# Patient Record
Sex: Male | Born: 1995 | Marital: Single | Smoking: Never smoker
Health system: Southern US, Community
[De-identification: ages and names within clinical notes are randomized; demographics above are authoritative.]

## PROBLEM LIST (undated history)

## (undated) DIAGNOSIS — K37 Unspecified appendicitis: Secondary | ICD-10-CM

## (undated) HISTORY — DX: Unspecified appendicitis: K37

---
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
## 2016-10-21 ENCOUNTER — Encounter: Payer: Self-pay | Admitting: Emergency Medicine

## 2016-10-21 ENCOUNTER — Emergency Department: Payer: Medicaid Other | Admitting: Registered Nurse

## 2016-10-21 ENCOUNTER — Encounter
Admission: EM | Disposition: A | Discharge status: Home / Self Care | Payer: Self-pay | Source: Home / Self Care | Attending: Emergency Medicine

## 2016-10-21 ENCOUNTER — Observation Stay
Admission: EM | Admit: 2016-10-21 | Discharge: 2016-10-22 | Disposition: A | Discharge status: Home / Self Care | Payer: Medicaid Other | Attending: Surgery | Admitting: Surgery

## 2016-10-21 ENCOUNTER — Emergency Department: Payer: Medicaid Other

## 2016-10-21 DIAGNOSIS — Z23 Encounter for immunization: Secondary | ICD-10-CM | POA: Diagnosis not present

## 2016-10-21 DIAGNOSIS — K353 Acute appendicitis with localized peritonitis: Principal | ICD-10-CM | POA: Insufficient documentation

## 2016-10-21 DIAGNOSIS — K37 Unspecified appendicitis: Secondary | ICD-10-CM

## 2016-10-21 DIAGNOSIS — K3580 Unspecified acute appendicitis: Secondary | ICD-10-CM | POA: Diagnosis present

## 2016-10-21 HISTORY — PX: LAPAROSCOPIC APPENDECTOMY: SHX408

## 2016-10-21 HISTORY — DX: Unspecified appendicitis: K37

## 2016-10-21 LAB — URINALYSIS COMPLETE WITH MICROSCOPIC (ARMC ONLY)
BACTERIA UA: NONE SEEN
Bilirubin Urine: NEGATIVE
GLUCOSE, UA: NEGATIVE mg/dL
Ketones, ur: NEGATIVE mg/dL
Leukocytes, UA: NEGATIVE
NITRITE: NEGATIVE
PROTEIN: 100 mg/dL — AB
SQUAMOUS EPITHELIAL / LPF: NONE SEEN
Specific Gravity, Urine: 1.01 (ref 1.005–1.030)
pH: 6 (ref 5.0–8.0)

## 2016-10-21 LAB — COMPREHENSIVE METABOLIC PANEL
ALT: 67 U/L — AB (ref 17–63)
ANION GAP: 10 (ref 5–15)
AST: 28 U/L (ref 15–41)
Albumin: 4.9 g/dL (ref 3.5–5.0)
Alkaline Phosphatase: 76 U/L (ref 38–126)
BUN: 7 mg/dL (ref 6–20)
CALCIUM: 9.8 mg/dL (ref 8.9–10.3)
CHLORIDE: 99 mmol/L — AB (ref 101–111)
CO2: 24 mmol/L (ref 22–32)
CREATININE: 0.84 mg/dL (ref 0.61–1.24)
Glucose, Bld: 125 mg/dL — ABNORMAL HIGH (ref 65–99)
Potassium: 3.3 mmol/L — ABNORMAL LOW (ref 3.5–5.1)
Sodium: 133 mmol/L — ABNORMAL LOW (ref 135–145)
Total Bilirubin: 2.2 mg/dL — ABNORMAL HIGH (ref 0.3–1.2)
Total Protein: 8.6 g/dL — ABNORMAL HIGH (ref 6.5–8.1)

## 2016-10-21 LAB — LIPASE, BLOOD: LIPASE: 16 U/L (ref 11–51)

## 2016-10-21 LAB — CBC
HCT: 46.3 % (ref 40.0–52.0)
HEMOGLOBIN: 16.2 g/dL (ref 13.0–18.0)
MCH: 27.6 pg (ref 26.0–34.0)
MCHC: 35 g/dL (ref 32.0–36.0)
MCV: 78.8 fL — AB (ref 80.0–100.0)
PLATELETS: 157 10*3/uL (ref 150–440)
RBC: 5.88 MIL/uL (ref 4.40–5.90)
RDW: 12.9 % (ref 11.5–14.5)
WBC: 22 10*3/uL — AB (ref 3.8–10.6)

## 2016-10-21 SURGERY — APPENDECTOMY, LAPAROSCOPIC
Anesthesia: General | Wound class: Clean Contaminated

## 2016-10-21 MED ORDER — ACETAMINOPHEN 10 MG/ML IV SOLN
INTRAVENOUS | Status: DC | PRN
  Administered 2016-10-21: 1000 mg via INTRAVENOUS

## 2016-10-21 MED ORDER — PANTOPRAZOLE SODIUM 40 MG IV SOLR
40.0000 mg | Freq: Every day | INTRAVENOUS | Status: DC
  Administered 2016-10-22: 40 mg via INTRAVENOUS
  Filled 2016-10-21: qty 40

## 2016-10-21 MED ORDER — CHLORHEXIDINE GLUCONATE CLOTH 2 % EX PADS
6.0000 | MEDICATED_PAD | Freq: Once | CUTANEOUS | Status: DC
  Filled 2016-10-21: qty 6

## 2016-10-21 MED ORDER — ROCURONIUM BROMIDE 100 MG/10ML IV SOLN
INTRAVENOUS | Status: DC | PRN
  Administered 2016-10-21: 20 mg via INTRAVENOUS
  Administered 2016-10-21: 40 mg via INTRAVENOUS

## 2016-10-21 MED ORDER — PROPOFOL 10 MG/ML IV BOLUS
INTRAVENOUS | Status: DC | PRN
  Administered 2016-10-21: 200 mg via INTRAVENOUS

## 2016-10-21 MED ORDER — DIPHENHYDRAMINE HCL 50 MG/ML IJ SOLN: 12.5000 mg | Freq: Four times a day (QID) | INTRAMUSCULAR | Status: DC | PRN

## 2016-10-21 MED ORDER — LIDOCAINE HCL (CARDIAC) 20 MG/ML IV SOLN
INTRAVENOUS | Status: DC | PRN
Start: 2016-10-21 — End: 2016-10-21
  Administered 2016-10-21: 100 mg via INTRAVENOUS

## 2016-10-21 MED ORDER — ACETAMINOPHEN 10 MG/ML IV SOLN
INTRAVENOUS | Status: AC
Start: 2016-10-21 — End: 2016-10-21
  Filled 2016-10-21: qty 100

## 2016-10-21 MED ORDER — SODIUM CHLORIDE 0.9 % IJ SOLN
INTRAMUSCULAR | Status: AC
  Filled 2016-10-21: qty 50

## 2016-10-21 MED ORDER — PROMETHAZINE HCL 25 MG/ML IJ SOLN: 6.2500 mg | INTRAMUSCULAR | Status: DC | PRN

## 2016-10-21 MED ORDER — IOPAMIDOL (ISOVUE-300) INJECTION 61%
100.0000 mL | Freq: Once | INTRAVENOUS | Status: AC | PRN
  Administered 2016-10-21: 100 mL via INTRAVENOUS

## 2016-10-21 MED ORDER — DEXAMETHASONE SODIUM PHOSPHATE 10 MG/ML IJ SOLN
INTRAMUSCULAR | Status: DC | PRN
  Administered 2016-10-21: 4 mg via INTRAVENOUS

## 2016-10-21 MED ORDER — SUGAMMADEX SODIUM 200 MG/2ML IV SOLN
INTRAVENOUS | Status: DC | PRN
  Administered 2016-10-21: 200 mg via INTRAVENOUS

## 2016-10-21 MED ORDER — ONDANSETRON HCL 4 MG/2ML IJ SOLN
INTRAMUSCULAR | Status: DC | PRN
  Administered 2016-10-21: 4 mg via INTRAVENOUS

## 2016-10-21 MED ORDER — LACTATED RINGERS IV SOLN
INTRAVENOUS | Status: DC | PRN
  Administered 2016-10-21: 21:00:00 via INTRAVENOUS

## 2016-10-21 MED ORDER — MIDAZOLAM HCL 2 MG/2ML IJ SOLN
INTRAMUSCULAR | Status: DC | PRN
  Administered 2016-10-21: 2 mg via INTRAVENOUS

## 2016-10-21 MED ORDER — FENTANYL CITRATE (PF) 100 MCG/2ML IJ SOLN
25.0000 ug | INTRAMUSCULAR | Status: DC | PRN
  Administered 2016-10-21 (×4): 25 ug via INTRAVENOUS

## 2016-10-21 MED ORDER — BUPIVACAINE-EPINEPHRINE (PF) 0.5% -1:200000 IJ SOLN
INTRAMUSCULAR | Status: DC | PRN
  Administered 2016-10-21: 15 mL

## 2016-10-21 MED ORDER — DIPHENHYDRAMINE HCL 12.5 MG/5ML PO ELIX: 12.5000 mg | ORAL_SOLUTION | Freq: Four times a day (QID) | ORAL | Status: DC | PRN

## 2016-10-21 MED ORDER — SODIUM CHLORIDE 0.9 % IV BOLUS (SEPSIS)
1000.0000 mL | Freq: Once | INTRAVENOUS | Status: AC
  Administered 2016-10-21: 1000 mL via INTRAVENOUS

## 2016-10-21 MED ORDER — ONDANSETRON 4 MG PO TBDP: 4.0000 mg | ORAL_TABLET | Freq: Four times a day (QID) | ORAL | Status: DC | PRN

## 2016-10-21 MED ORDER — ENOXAPARIN SODIUM 40 MG/0.4ML ~~LOC~~ SOLN
40.0000 mg | SUBCUTANEOUS | Status: DC
  Administered 2016-10-22: 40 mg via SUBCUTANEOUS
  Filled 2016-10-21: qty 0.4

## 2016-10-21 MED ORDER — ONDANSETRON HCL 4 MG/2ML IJ SOLN
4.0000 mg | Freq: Four times a day (QID) | INTRAMUSCULAR | Status: DC | PRN
  Administered 2016-10-21: 4 mg via INTRAVENOUS

## 2016-10-21 MED ORDER — DEXTROSE 5 % IV SOLN
2.0000 g | Freq: Three times a day (TID) | INTRAVENOUS | Status: AC
  Administered 2016-10-22 (×2): 2 g via INTRAVENOUS
  Filled 2016-10-21 (×2): qty 2

## 2016-10-21 MED ORDER — OXYCODONE-ACETAMINOPHEN 5-325 MG PO TABS: 1.0000 | ORAL_TABLET | ORAL | Status: DC | PRN

## 2016-10-21 MED ORDER — DEXTROSE 5 % IV SOLN
2.0000 g | INTRAVENOUS | Status: AC
  Administered 2016-10-21: 2 g via INTRAVENOUS
  Filled 2016-10-21: qty 2

## 2016-10-21 MED ORDER — SODIUM CHLORIDE 0.9 % IJ SOLN
INTRAMUSCULAR | Status: DC | PRN
  Administered 2016-10-21: 15 mL

## 2016-10-21 MED ORDER — MORPHINE SULFATE (PF) 4 MG/ML IV SOLN: 2.0000 mg | INTRAVENOUS | Status: DC | PRN

## 2016-10-21 MED ORDER — FENTANYL CITRATE (PF) 100 MCG/2ML IJ SOLN
INTRAMUSCULAR | Status: DC | PRN
  Administered 2016-10-21: 100 ug via INTRAVENOUS
  Administered 2016-10-21 (×2): 50 ug via INTRAVENOUS

## 2016-10-21 MED ORDER — BUPIVACAINE-EPINEPHRINE (PF) 0.5% -1:200000 IJ SOLN
INTRAMUSCULAR | Status: AC
  Filled 2016-10-21: qty 30

## 2016-10-21 MED ORDER — LACTATED RINGERS IV SOLN
INTRAVENOUS | Status: DC
  Administered 2016-10-22 (×2): via INTRAVENOUS

## 2016-10-21 MED ORDER — IOPAMIDOL (ISOVUE-300) INJECTION 61%
30.0000 mL | Freq: Once | INTRAVENOUS | Status: AC | PRN
  Administered 2016-10-21: 30 mL via ORAL

## 2016-10-21 MED ORDER — KETOROLAC TROMETHAMINE 30 MG/ML IJ SOLN
30.0000 mg | Freq: Four times a day (QID) | INTRAMUSCULAR | Status: DC
  Administered 2016-10-22 (×2): 30 mg via INTRAVENOUS
  Filled 2016-10-21 (×2): qty 1

## 2016-10-21 MED ORDER — FENTANYL CITRATE (PF) 100 MCG/2ML IJ SOLN
INTRAMUSCULAR | Status: AC
  Filled 2016-10-21: qty 2

## 2016-10-21 MED ORDER — KETOROLAC TROMETHAMINE 30 MG/ML IJ SOLN
INTRAMUSCULAR | Status: DC | PRN
  Administered 2016-10-21: 30 mg via INTRAVENOUS

## 2016-10-21 MED ORDER — ONDANSETRON HCL 4 MG/2ML IJ SOLN
INTRAMUSCULAR | Status: AC
  Filled 2016-10-21: qty 2

## 2016-10-21 SURGICAL SUPPLY — 36 items
APPLIER CLIP 5 13 M/L LIGAMAX5 (MISCELLANEOUS)
BLADE CLIPPER SURG (BLADE) ×3 IMPLANT
CANISTER SUCT 1200ML W/VALVE (MISCELLANEOUS) ×3 IMPLANT
CHLORAPREP W/TINT 26ML (MISCELLANEOUS) ×3 IMPLANT
CLIP APPLIE 5 13 M/L LIGAMAX5 (MISCELLANEOUS) IMPLANT
CUTTER FLEX LINEAR 45M (STAPLE) ×3 IMPLANT
DERMABOND ADVANCED (GAUZE/BANDAGES/DRESSINGS) ×2
DERMABOND ADVANCED .7 DNX12 (GAUZE/BANDAGES/DRESSINGS) ×1 IMPLANT
ELECT CAUTERY BLADE 6.4 (BLADE) ×3 IMPLANT
ELECT REM PT RETURN 9FT ADLT (ELECTROSURGICAL) ×3
ELECTRODE REM PT RTRN 9FT ADLT (ELECTROSURGICAL) ×1 IMPLANT
ENDOPOUCH RETRIEVER 10 (MISCELLANEOUS) ×3 IMPLANT
GLOVE BIO SURGEON STRL SZ7 (GLOVE) ×12 IMPLANT
GOWN STRL REUS W/ TWL LRG LVL3 (GOWN DISPOSABLE) ×2 IMPLANT
GOWN STRL REUS W/TWL LRG LVL3 (GOWN DISPOSABLE) ×4
IRRIGATION STRYKERFLOW (MISCELLANEOUS) ×1 IMPLANT
IRRIGATOR STRYKERFLOW (MISCELLANEOUS) ×3
IV NS 1000ML (IV SOLUTION) ×2
IV NS 1000ML BAXH (IV SOLUTION) ×1 IMPLANT
NEEDLE HYPO 22GX1.5 SAFETY (NEEDLE) ×3 IMPLANT
NS IRRIG 500ML POUR BTL (IV SOLUTION) ×3 IMPLANT
PACK LAP CHOLECYSTECTOMY (MISCELLANEOUS) ×3 IMPLANT
PENCIL ELECTRO HAND CTR (MISCELLANEOUS) ×3 IMPLANT
RELOAD 45 VASCULAR/THIN (ENDOMECHANICALS) IMPLANT
RELOAD STAPLE TA45 3.5 REG BLU (ENDOMECHANICALS) ×3 IMPLANT
SCALPEL HARMONIC ACE (MISCELLANEOUS) ×3 IMPLANT
SCISSORS METZENBAUM CVD 33 (INSTRUMENTS) ×3 IMPLANT
SLEEVE ENDOPATH XCEL 5M (ENDOMECHANICALS) ×3 IMPLANT
SPONGE LAP 18X18 5 PK (GAUZE/BANDAGES/DRESSINGS) ×3 IMPLANT
SUT MNCRL AB 4-0 PS2 18 (SUTURE) ×3 IMPLANT
SUT VICRYL 0 AB UR-6 (SUTURE) ×6 IMPLANT
SYR 20CC LL (SYRINGE) ×3 IMPLANT
TRAY FOLEY W/METER SILVER 16FR (SET/KITS/TRAYS/PACK) IMPLANT
TROCAR XCEL BLUNT TIP 100MML (ENDOMECHANICALS) ×3 IMPLANT
TROCAR XCEL NON-BLD 5MMX100MML (ENDOMECHANICALS) ×3 IMPLANT
TUBING INSUF HEATED (TUBING) ×3 IMPLANT

## 2016-10-21 NOTE — Anesthesia Preprocedure Evaluation (Signed)
Anesthesia Evaluation  Patient identified by MRN, date of birth, ID band Patient awake    Reviewed: Allergy & Precautions, H&P , NPO status , Patient's Chart, lab work & pertinent test results, reviewed documented beta blocker date and time   History of Anesthesia Complications Negative for: history of anesthetic complications  Airway Mallampati: I  TM Distance: >3 FB Neck ROM: full    Dental no notable dental hx. (+) Teeth Intact   Pulmonary neg pulmonary ROS,    Pulmonary exam normal breath sounds clear to auscultation       Cardiovascular Exercise Tolerance: Good negative cardio ROS Normal cardiovascular exam Rhythm:regular Rate:Normal     Neuro/Psych negative neurological ROS  negative psych ROS   GI/Hepatic negative GI ROS, Neg liver ROS,   Endo/Other  negative endocrine ROS  Renal/GU negative Renal ROS  negative genitourinary   Musculoskeletal   Abdominal   Peds  Hematology negative hematology ROS (+)   Anesthesia Other Findings History reviewed. No pertinent past medical history.   Reproductive/Obstetrics negative OB ROS                             Anesthesia Physical Anesthesia Plan  ASA: I  Anesthesia Plan: General, Rapid Sequence and Cricoid Pressure   Post-op Pain Management:    Induction:   Airway Management Planned:   Additional Equipment:   Intra-op Plan:   Post-operative Plan:   Informed Consent: I have reviewed the patients History and Physical, chart, labs and discussed the procedure including the risks, benefits and alternatives for the proposed anesthesia with the patient or authorized representative who has indicated his/her understanding and acceptance.   Dental Advisory Given  Plan Discussed with: Anesthesiologist, CRNA and Surgeon  Anesthesia Plan Comments:         Anesthesia Quick Evaluation

## 2016-10-21 NOTE — Op Note (Signed)
laparascopic appendectomy   Oscar PicketKamill J Weems Date of operation:  10/21/2016  Indications: The patient presented with a history of  abdominal pain. Workup has revealed findings consistent with acute appendicitis.  Pre-operative Diagnosis: Appendicitis  Post-operative Diagnosis: Same  Surgeon: Sterling Bigiego Makaveli Hoard, MD, FACS  Anesthesia: General with endotracheal tube  Findings: Acute non perforated necrotizing appendicitis  Estimated Blood Loss: 20cc         Specimens: appendix         Complications:  none  Procedure Details  The patient was seen again in the preop area. The options of surgery versus observation were reviewed with the patient and/or family. The risks of bleeding, infection, recurrence of symptoms, negative laparoscopy, potential for an open procedure, bowel injury, abscess or infection, were all reviewed as well. The patient was taken to Operating Room, identified as Oscar Lynch and the procedure verified as laparoscopic appendectomy. A Time Out was held and the above information confirmed.  The patient was placed in the supine position and general anesthesia was induced.  Antibiotic prophylaxis was administered and VT E prophylaxis was in place.   The abdomen was prepped and draped in a sterile fashion. An infraumbilical incision was made. A cutdown technique was used to enter the abdominal cavity. Two vicryl stitches were placed on the fascia and a Hasson trocar inserted. Pneumoperitoneum obtained. Two 5 mm ports were placed under direct visualization.   The appendix was identified and found to be acutely inflamed and necrotic but w/o perforation or abscess. The appendix was carefully dissected. The base of the appendix was dissected out and divided with a standard load Endo GIA. The mesoappendix was divided withHarmonic scalpel. The appendix was passed out through the left lateral port site with the aid of an Endo Catch bag. The right lower quadrant and pelvis was then  irrigated with copious amounts of normal saline which was aspirated. Inspection  failed to identify any additional bleeding and there were no signs of bowel injury.  Again the right lower quadrant was inspected there was no sign of bleeding or bowel injury therefore pneumoperitoneum was released, all ports were removed, the fascia was closed w 0 interrupted vicryls and  the skin incisions were approximated with subcuticular 4-0 Monocryl. Dermabond was applied.  The patient tolerated the procedure well, there were no complications. The sponge lap and needle count were correct at the end of the procedure.  The patient was taken to the recovery room in stable condition to be admitted for continued care.    Sterling Bigiego Deland Slocumb, MD FACS

## 2016-10-21 NOTE — Transfer of Care (Signed)
Immediate Anesthesia Transfer of Care Note  Patient: Oscar PicketKamill J Carrithers  Procedure(s) Performed: Procedure(s): APPENDECTOMY LAPAROSCOPIC (N/A)  Patient Location: PACU  Anesthesia Type:General  Level of Consciousness: sedated  Airway & Oxygen Therapy: Patient Spontanous Breathing and Patient connected to face mask oxygen  Post-op Assessment: Report given to RN and Post -op Vital signs reviewed and stable  Post vital signs: Reviewed and stable  Last Vitals:  Vitals:   10/21/16 1937 10/21/16 2236  BP: 132/85 (!) 146/80  Pulse: (!) 105   Resp: 20 (!) 22  Temp:      Complications: No apparent anesthesia complications

## 2016-10-21 NOTE — Progress Notes (Signed)
Primary nurse paged and spoke to Dr. Everlene FarrierPabon in regard to diet order. Orders received for clear liquids. Primary nurse to continue to monitor.

## 2016-10-21 NOTE — H&P (Addendum)
Patient ID: Oscar Lynch, male   DOB: [birth year], 20 y.o.   MRN: [id number]  HPI Oscar Lynch is a 20 y.o. male presented with less than 24-hour history of abdominal pain patient reports that his pain is sharp is severe and located in the right lower quadrant, non radiated. Pain is worsening with movement and when he coughs. He does have arteries initiated nausea and had some emesis. He does have  Hyporexia and chills. He has never had anything like this before. This plane was mildly improved with NSAIDs that he took at home. No previous abdominal operations and he does not have any medical history. He has good cardiac vascular performance and is able to do more than 6 Mets without shortness of breath or chest pain CT scan personally reviewed revealing evidence of a dilated appendix with an appendicolith. No evidence of abscess or free air to suggest any perforation. He does have a white count of 22,000 with a left shift  HPI  History reviewed. No pertinent past medical history.  History reviewed. No pertinent surgical history.  No family history on file.  Social History Social History  Substance Use Topics  . Smoking status: Never Smoker  . Smokeless tobacco: Never Used  . Alcohol use No    No Known Allergies  No current facility-administered medications for this encounter.    No current outpatient prescriptions on file.     Review of Systems A 10 point review of systems was asked and was negative except for the information on the HPI  Physical Exam Blood pressure 132/85, pulse (!) 105, temperature 98 F (36.7 C), temperature source Oral, resp. rate 20, height 5\' 10"  (1.778 m), weight 95.3 kg (210 lb), SpO2 99 %. CONSTITUTIONAL: NAD awake , alert EYES: Pupils are equal, round, and reactive to light, Sclera are non-icteric. EARS, NOSE, MOUTH AND THROAT: The oropharynx is clear. The oral mucosa is pink and moist. Hearing is intact to voice. LYMPH NODES:  Lymph nodes in the  neck are normal. RESPIRATORY:  Lungs are clear. There is normal respiratory effort, with equal breath sounds bilaterally, and without pathologic use of accessory muscles. CARDIOVASCULAR: Heart is regular without murmurs, sinus tachycardia gallops, or rubs. GI: The abdomen is  soft, + rebound and focal peritonitis on RLQ.  GU: Rectal deferred.   MUSCULOSKELETAL: Normal muscle strength and tone. No cyanosis or edema.   SKIN: Turgor is good and there are no pathologic skin lesions or ulcers. NEUROLOGIC: Motor and sensation is grossly normal. Cranial nerves are grossly intact. PSYCH:  Oriented to person, place and time. Affect is normal.  Data Reviewed I have personally reviewed the patient's imaging, laboratory findings and medical records.    Assessment/Plan Acute appendicitis with early systemic inflammatory response syndrome cracked arise by rebound tenderness, tachycardia and increased white count with a left shift. A lengthy discussion with his father and the patient himself and I do recommend appendectomy given the above-mentioned signs. Discussed with the patient in detail about the procedure. Risk, benefits and possible complications including but not limited to: Bleeding, infection, re-interventions, injury to surrounding structures, conversion to open. I specifically discussed antibiotic therapy that would not be indicated in this case given that he has focal peritonitis with inflammatory response symptoms. They understand and wish to proceed    Sterling Bigiego Pabon, MD FACS General Surgeon 10/21/2016, 8:37 PM

## 2016-10-21 NOTE — ED Triage Notes (Addendum)
States he developed abd pain today  Vomited times 2 earlier.  abd tender on palpation  At umbilicus   No fever at home    Was given Advil PTA

## 2016-10-21 NOTE — Anesthesia Procedure Notes (Signed)
Procedure Name: Intubation Date/Time: 10/21/2016 9:19 PM Performed by: Stormy FabianURTIS, Chaney Ingram Pre-anesthesia Checklist: Patient identified, Patient being monitored, Timeout performed, Emergency Drugs available and Suction available Patient Re-evaluated:Patient Re-evaluated prior to inductionOxygen Delivery Method: Circle system utilized Preoxygenation: Pre-oxygenation with 100% oxygen Intubation Type: IV induction Ventilation: Mask ventilation without difficulty Laryngoscope Size: Mac and 3 Grade View: Grade I Tube type: Oral Tube size: 7.5 mm Number of attempts: 1 Airway Equipment and Method: Stylet Placement Confirmation: ETT inserted through vocal cords under direct vision,  positive ETCO2 and breath sounds checked- equal and bilateral Secured at: 22 cm Tube secured with: Tape Dental Injury: Teeth and Oropharynx as per pre-operative assessment

## 2016-10-21 NOTE — ED Provider Notes (Signed)
[facility] Emergency Department Provider Note    ____________________________________________   I have reviewed the triage vital signs and the nursing notes.   HISTORY  Chief Complaint Abdominal Pain   History limited by: Not Limited   HPI Oscar Lynch is a 20 y.o. male who presents to the emergency department today because of concerns for abdominal pain. The patient states he has been having some abdominal discomfort for the past 2 days. It was simply abdominal aching. Today however the pain started to come straight more in the right lower quadrant. He did havesome associated vomiting. He states no diarrhea in fact he has felt somewhat constipated. Father thinks that he did have a fever yesterday. In addition he had chills today. Has tried some Pepto-Bismol and Advil home with minimal relief.    History reviewed. No pertinent past medical history.  There are no active problems to display for this patient.   History reviewed. No pertinent surgical history.  Prior to Admission medications   Not on File    Allergies Patient has no known allergies.  No family history on file.  Social History Social History  Substance Use Topics  . Smoking status: Never Smoker  . Smokeless tobacco: Never Used  . Alcohol use No    Review of Systems  Constitutional: Negative for fever. Cardiovascular: Negative for chest pain. Respiratory: Negative for shortness of breath. Gastrointestinal: Positive for abdominal pain. Genitourinary: Negative for dysuria. Musculoskeletal: Negative for back pain. Skin: Negative for rash. Neurological: Negative for headaches, focal weakness or numbness.  10-point ROS otherwise negative.  ____________________________________________   PHYSICAL EXAM:  VITAL SIGNS: ED Triage Vitals  Enc Vitals Group     BP 10/21/16 1721 (!) 126/104     Pulse Rate 10/21/16 1721 (!) 105     Resp 10/21/16 1721 20     Temp 10/21/16  1721 98 F (36.7 C)     Temp Source 10/21/16 1721 Oral     SpO2 10/21/16 1721 96 %     Weight 10/21/16 1719 210 lb (95.3 kg)     Height 10/21/16 1719 5\' 10"  (1.778 m)     Head Circumference --      Peak Flow --      Pain Score 10/21/16 1719 7   Constitutional: Alert and oriented. Well appearing and in no distress. Eyes: Conjunctivae are normal. Normal extraocular movements. ENT   Head: Normocephalic and atraumatic.   Nose: No congestion/rhinnorhea.   Mouth/Throat: Mucous membranes are moist.   Neck: No stridor. Hematological/Lymphatic/Immunilogical: No cervical lymphadenopathy. Cardiovascular: Normal rate, regular rhythm.  No murmurs, rubs, or gallops.  Respiratory: Normal respiratory effort without tachypnea nor retractions. Breath sounds are clear and equal bilaterally. No wheezes/rales/rhonchi. Gastrointestinal: Soft. Tender to palpation in the right lower quadrant. Positive rebound. Positive Rovsing's sign. Genitourinary: Deferred Musculoskeletal: Normal range of motion in all extremities. No lower extremity edema. Neurologic:  Normal speech and language. No gross focal neurologic deficits are appreciated.  Skin:  Skin is warm, dry and intact. No rash noted. Psychiatric: Mood and affect are normal. Speech and behavior are normal. Patient exhibits appropriate insight and judgment.  ____________________________________________    LABS (pertinent positives/negatives)  Labs Reviewed  COMPREHENSIVE METABOLIC PANEL - Abnormal; Notable for the following:       Result Value   Sodium 133 (*)    Potassium 3.3 (*)    Chloride 99 (*)    Glucose, Bld 125 (*)    Total Protein 8.6 (*)  ALT 67 (*)    Total Bilirubin 2.2 (*)    All other components within normal limits  CBC - Abnormal; Notable for the following:    WBC 22.0 (*)    MCV 78.8 (*)    All other components within normal limits  URINALYSIS COMPLETEWITH MICROSCOPIC ([facility] ONLY) - Abnormal; Notable for the  following:    Color, Urine YELLOW (*)    APPearance CLEAR (*)    Hgb urine dipstick 1+ (*)    Protein, ur 100 (*)    All other components within normal limits  LIPASE, BLOOD     ____________________________________________   EKG  None  ____________________________________________    RADIOLOGY  CT abd/pel IMPRESSION:  1. Appendicitis without complicating feature.  2. Liver appears fatty.   ____________________________________________   PROCEDURES  Procedures  ____________________________________________   INITIAL IMPRESSION / ASSESSMENT AND PLAN / ED COURSE  Pertinent labs & imaging results that were available during my care of the patient were reviewed by me and considered in my medical decision making (see chart for details).  Patient with RLQ pain. Elevated WBC. Physical exam findings concerning for appendicitis. Will get ct scan.   Clinical Course    CT scan positive for appendicitis. Will admit to surgical service.  ____________________________________________   FINAL CLINICAL IMPRESSION(S) / ED DIAGNOSES  Final diagnoses:  Acute appendicitis, unspecified acute appendicitis type     Note: This dictation was prepared with Dragon dictation. Any transcriptional errors that result from this process are unintentional     Phineas SemenGraydon Smrithi Pigford, MD 10/21/16 2006

## 2016-10-22 ENCOUNTER — Encounter: Payer: Self-pay | Admitting: Surgery

## 2016-10-22 DIAGNOSIS — K3580 Unspecified acute appendicitis: Secondary | ICD-10-CM | POA: Diagnosis not present

## 2016-10-22 MED ORDER — INFLUENZA VAC SPLIT QUAD 0.5 ML IM SUSY: 0.5000 mL | PREFILLED_SYRINGE | INTRAMUSCULAR | Status: DC

## 2016-10-22 MED ORDER — OXYCODONE HCL 5 MG PO TABS: 5.0000 mg | ORAL_TABLET | Freq: Four times a day (QID) | ORAL | 0 refills | Status: DC | PRN

## 2016-10-22 MED ORDER — INFLUENZA VAC SPLIT QUAD 0.5 ML IM SUSY
0.5000 mL | PREFILLED_SYRINGE | Freq: Once | INTRAMUSCULAR | Status: AC
  Administered 2016-10-22: 0.5 mL via INTRAMUSCULAR
  Filled 2016-10-22: qty 0.5

## 2016-10-22 MED ORDER — IBUPROFEN 600 MG PO TABS: 600.0000 mg | ORAL_TABLET | Freq: Three times a day (TID) | ORAL | 0 refills | Status: DC | PRN

## 2016-10-22 NOTE — Progress Notes (Signed)
10/22/2016  Subjective: Patient is 1 Day Post-Op status post laparoscopic appendectomy. Today he has been tolerating a diet and has been well-controlled. Denies any nausea or vomiting.  Vital signs: Temp:  [97.9 F (36.6 C)-98.7 F (37.1 C)] 98.3 F (36.8 C) (11/27 0901) Pulse Rate:  [91-112] 106 (11/27 0901) Resp:  [11-25] 21 (11/27 0901) BP: (110-146)/(50-104) 111/50 (11/27 0901) SpO2:  [94 %-100 %] 99 % (11/27 0901) Weight:  [95.3 kg (210 lb)-107.7 kg (237 lb 6.4 oz)] 107.7 kg (237 lb 6.4 oz) (11/27 0557)   Intake/Output: 11/26 0701 - 11/27 0700 In: 1260.4 [I.V.:1260.4] Out: 800 [Urine:800] Last BM Date: 10/21/16  Physical Exam: Constitutional: No acute distress Abdomen: Soft, nondistended, properly tender to palpation. Incisions are clean dry and intact with Dermabond. Mild ecchymosis around the incisions.  Labs:   Recent Labs  10/21/16 1724  WBC 22.0*  HGB 16.2  HCT 46.3  PLT 157    Recent Labs  10/21/16 1724  NA 133*  K 3.3*  CL 99*  CO2 24  GLUCOSE 125*  BUN 7  CREATININE 0.84  CALCIUM 9.8   No results for input(s): LABPROT, INR in the last 72 hours.  Imaging: Ct Abdomen Pelvis W Contrast  Result Date: 10/21/2016 CLINICAL DATA:  Nausea and vomiting last night with right lower quadrant pain today. EXAM: CT ABDOMEN AND PELVIS WITH CONTRAST TECHNIQUE: Multidetector CT imaging of the abdomen and pelvis was performed using the standard protocol following bolus administration of intravenous contrast. CONTRAST:  100mL ISOVUE-300 IOPAMIDOL (ISOVUE-300) INJECTION 61%, 30mL ISOVUE-300 IOPAMIDOL (ISOVUE-300) INJECTION 61% COMPARISON:  None. FINDINGS: Lower chest: Lung bases show no acute findings. Heart size normal. No pericardial or pleural effusion. Hepatobiliary: Liver is decreased in attenuation diffusely. Liver and gallbladder are otherwise unremarkable. No biliary ductal dilatation. Pancreas: Negative. Spleen: Negative. Adrenals/Urinary Tract: Adrenal glands  and kidneys are unremarkable. Ureters are decompressed. Bladder is unremarkable. Stomach/Bowel: Stomach and small bowel are unremarkable. Appendix is dilated and contains fluid, air and an appendicolith. Associated periappendiceal stranding and fluid. No extraluminal air or organized fluid collection to indicate rupture or abscess. Colon is unremarkable. Vascular/Lymphatic: Vascular structures are unremarkable. No pathologically enlarged lymph nodes. Ileocolic mesenteric lymph nodes are sub cm in short axis size. Reproductive: Prostate is visualized. Other: No free fluid. Mesenteries and peritoneum are otherwise unremarkable. Musculoskeletal: No worrisome lytic or sclerotic lesions. IMPRESSION: 1. Appendicitis without complicating feature. 2. Liver appears fatty. Electronically Signed   By: Leanna BattlesMelinda  Blietz M.D.   On: 10/21/2016 19:32    Assessment/Plan: 20 yo male s/p laparoscopic appendectomy.    --Advance diet today --D/C home today.   Oscar IllJose Luis Eisa Conaway, MD [facility]

## 2016-10-22 NOTE — Progress Notes (Signed)
IV was removed. Discharge instructions, follow-up appointments, and prescriptions were provided to the pt. All questions were answered.The pt was taken downstairs via wheelchair by [employer].

## 2016-10-22 NOTE — Discharge Summary (Signed)
Patient ID: Oscar Lynch MRN: [id number] DOB/AGE: 1996/09/19 20 y.o.  Admit date: 10/21/2016 Discharge date: 10/22/2016   Discharge Diagnoses:  Active Problems:   Acute appendicitis   Appendicitis, acute   Procedures:  Laparoscopic appendectomy  Hospital Course:  Patient was admitted on 11/26 with acute appendicitis and was taken to the Operating room.  He underwent a laparoscopic appendectomy without complications.  His diet was advanced and he was tolerating without nausea/vomiting. His pain was well controlled.  He was ambulating and voiding and was deemed ready for discharge.  Consults: None  Disposition:  Home, self care  Discharge Instructions    Call MD for:  difficulty breathing, headache or visual disturbances    Complete by:  As directed    Call MD for:  persistant nausea and vomiting    Complete by:  As directed    Call MD for:  redness, tenderness, or signs of infection (pain, swelling, redness, odor or green/yellow discharge around incision site)    Complete by:  As directed    Call MD for:  severe uncontrolled pain    Complete by:  As directed    Call MD for:  temperature >100.4    Complete by:  As directed    Diet - low sodium heart healthy    Complete by:  As directed    Discharge instructions    Complete by:  As directed    Patient may shower but do not scrub wounds heavily and dab dry only.  Do not apply ointments or hydrogen peroxide to wounds.   Driving Restrictions    Complete by:  As directed    Do not drive while taking narcotics for pain control.   Increase activity slowly    Complete by:  As directed    Lifting restrictions    Complete by:  As directed    No heavy lifting of more than 10-15 lbs for 4 weeks.   No dressing needed    Complete by:  As directed        Medication List    TAKE these medications   ibuprofen 600 MG tablet Commonly known as:  ADVIL,MOTRIN Take 1 tablet (600 mg total) by mouth every 8 (eight) hours as needed for  fever or mild pain.   oxyCODONE 5 MG immediate release tablet Commonly known as:  Oxy IR/ROXICODONE Take 1-2 tablets (5-10 mg total) by mouth every 6 (six) hours as needed for severe pain.      Follow-up Information    Leafy Roiego F Pabon, MD Follow up in 2 week(s).   Specialty:  General Surgery Contact information: [address] [place] [address] [phone]        Leafy Roiego F Pabon, MD .   Specialty:  General Surgery Contact information: [address] [place] [address] [phone]

## 2016-10-23 LAB — SURGICAL PATHOLOGY

## 2016-10-23 NOTE — Anesthesia Postprocedure Evaluation (Signed)
Anesthesia Post Note  Patient: Oscar Lynch  Procedure(s) Performed: Procedure(s) (LRB): APPENDECTOMY LAPAROSCOPIC (N/A)  Patient location during evaluation: PACU Anesthesia Type: General Level of consciousness: awake and alert Pain management: pain level controlled Vital Signs Assessment: post-procedure vital signs reviewed and stable Respiratory status: spontaneous breathing, nonlabored ventilation, respiratory function stable and patient connected to nasal cannula oxygen Cardiovascular status: blood pressure returned to baseline and stable Postop Assessment: no signs of nausea or vomiting Anesthetic complications: no    Last Vitals:  Vitals:   10/22/16 0531 10/22/16 0901  BP: (!) 110/53 (!) 111/50  Pulse: 98 (!) 106  Resp: (!) 22 (!) 21  Temp: 36.8 C 36.8 C    Last Pain:  Vitals:   10/22/16 0901  TempSrc: Oral  PainSc: 1                  Lenard SimmerAndrew Liem Copenhaver

## 2016-11-07 ENCOUNTER — Ambulatory Visit (INDEPENDENT_AMBULATORY_CARE_PROVIDER_SITE_OTHER): Payer: Medicaid Other | Admitting: Surgery

## 2016-11-07 ENCOUNTER — Encounter: Payer: Self-pay | Admitting: Surgery

## 2016-11-07 VITALS — BP 126/82 | HR 101 | Temp 98.6°F | Ht 70.0 in | Wt 230.4 lb

## 2016-11-07 DIAGNOSIS — Z09 Encounter for follow-up examination after completed treatment for conditions other than malignant neoplasm: Secondary | ICD-10-CM

## 2016-11-07 NOTE — Progress Notes (Signed)
S/p lap appy Doing very well Tolerating Po, ambulating VSS  PE: NAD Abd: soft, incisions c/d/i, no infection  A/P Doing well path d/w pt No surgical issues RTC prn, no heavy lifting

## 2016-11-07 NOTE — Patient Instructions (Signed)

## 2017-11-25 IMAGING — CT CT ABD-PELV W/ CM
2 of 4 series · 16 of 46 positions shown, 18 images · IV contrast (iopamidol)
Comparison: None.

CLINICAL DATA: Nausea and vomiting last night with right lower
quadrant pain today.

EXAM:
CT ABDOMEN AND PELVIS WITH CONTRAST
TECHNIQUE: Multidetector CT imaging of the abdomen and pelvis was performed
using the standard protocol following bolus administration of
intravenous contrast.
CONTRAST:  100mL 0AUA6R-1LL IOPAMIDOL (0AUA6R-1LL) INJECTION 61%,
30mL 0AUA6R-1LL IOPAMIDOL (0AUA6R-1LL) INJECTION 61%

[Series 2: axial st · axial · 0.79mm/px · z∈[-523,-68]mm · 13 of 101 slices shown, 15 images]
[im 5/101  soft-tissue]
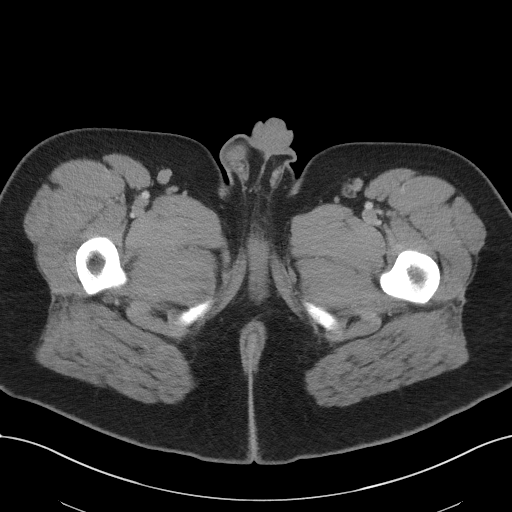
[im 5/101  bone]
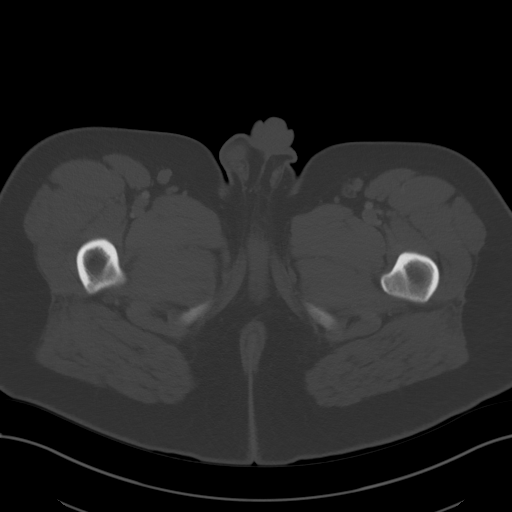
[im 13/101  soft-tissue]
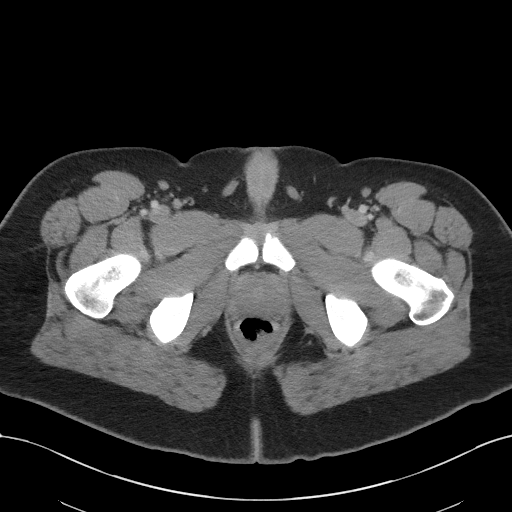
[im 21/101  soft-tissue]
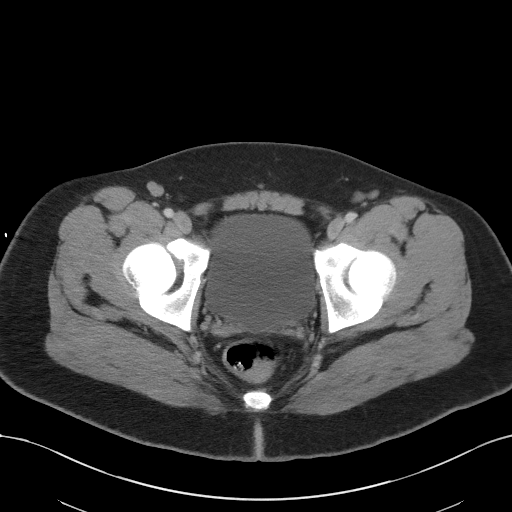
[im 30/101  soft-tissue]
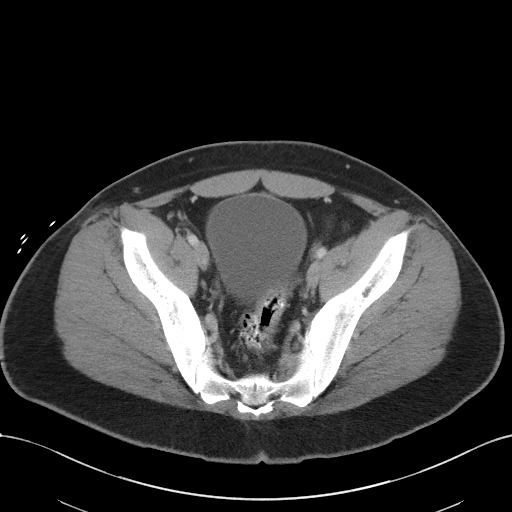
[im 34/101  soft-tissue]
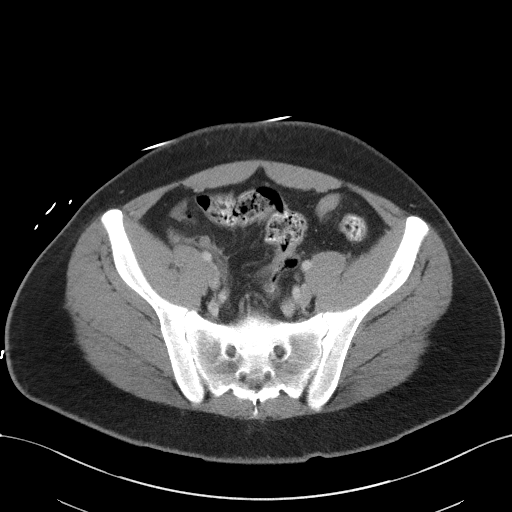
[im 42/101  soft-tissue]
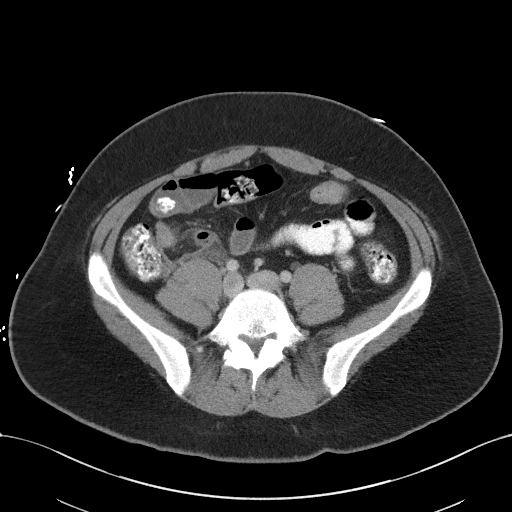
[im 51/101  soft-tissue]
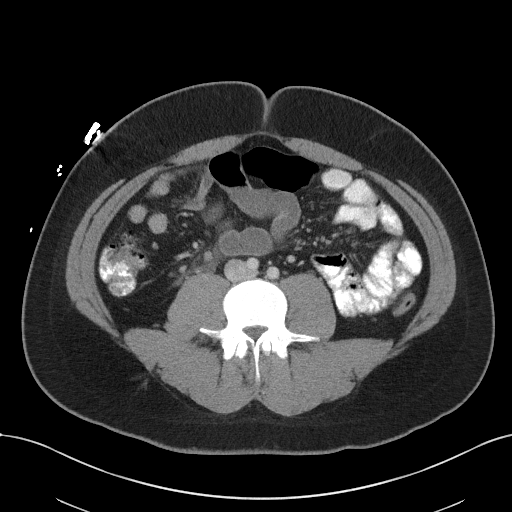
[im 59/101  soft-tissue]
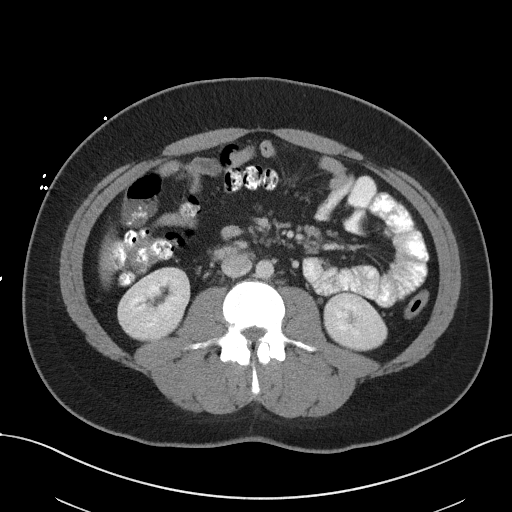
[im 67/101  soft-tissue]
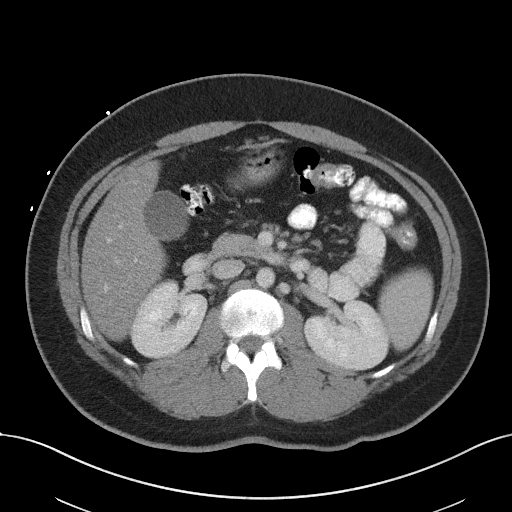
[im 67/101  bone]
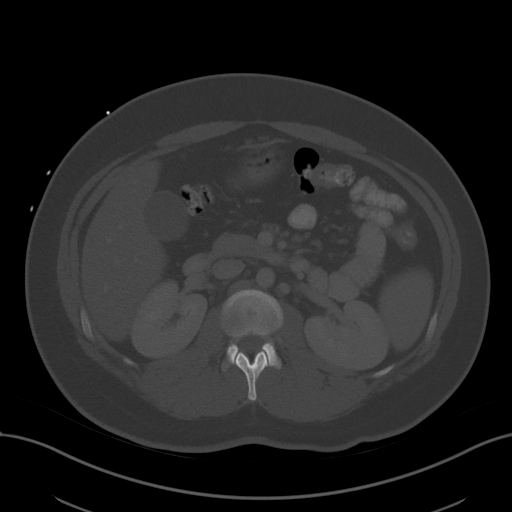
[im 71/101  soft-tissue]
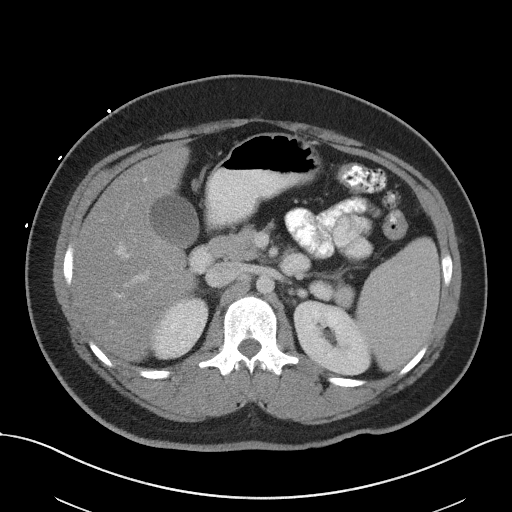
[im 80/101  soft-tissue]
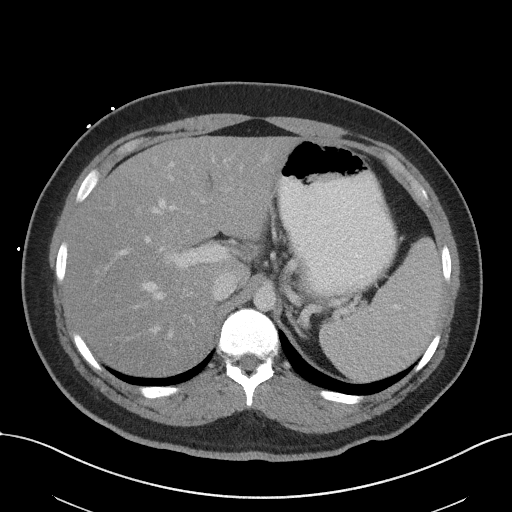
[im 88/101  soft-tissue]
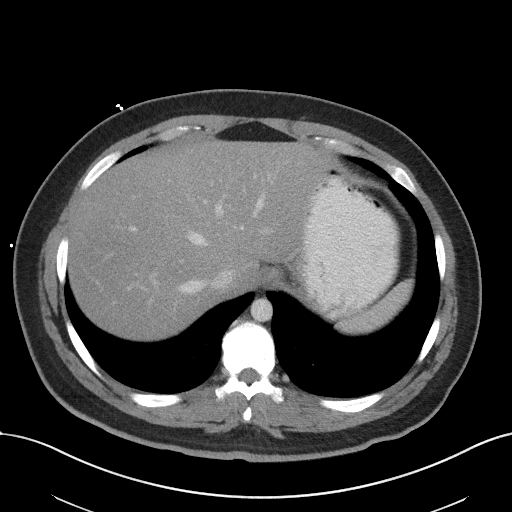
[im 96/101  soft-tissue]
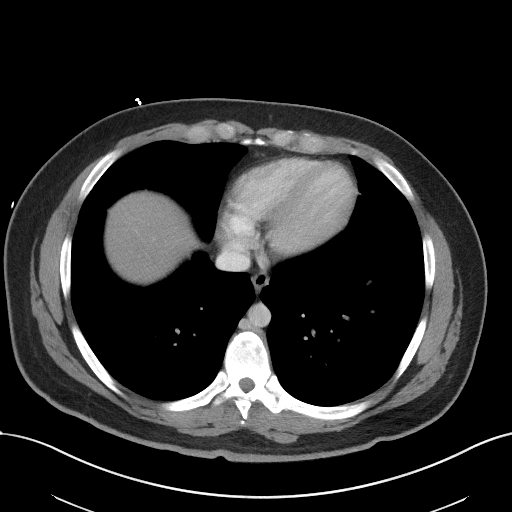

[Series 5: coronal st · coronal · 0.68mm/px · 3 of 102 slices shown]
[im 34/102  soft-tissue]
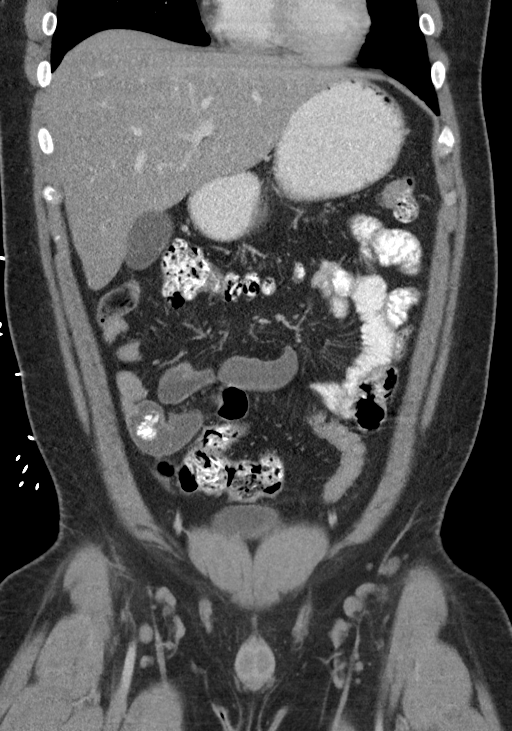
[im 45/102  soft-tissue]
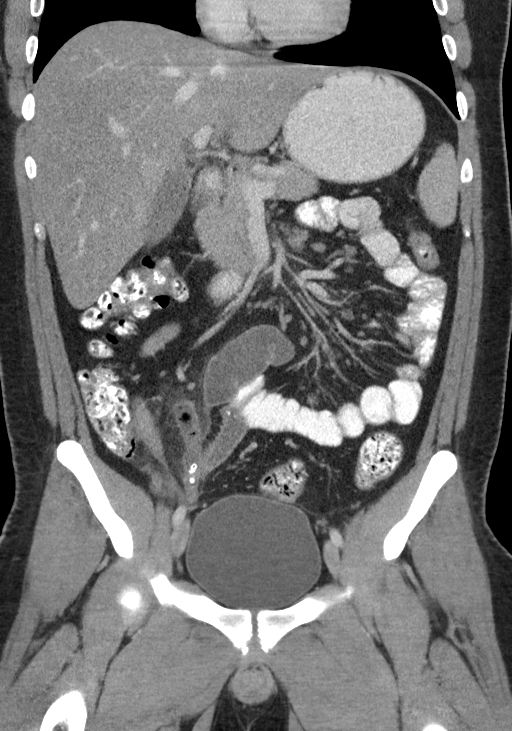
[im 57/102  soft-tissue]
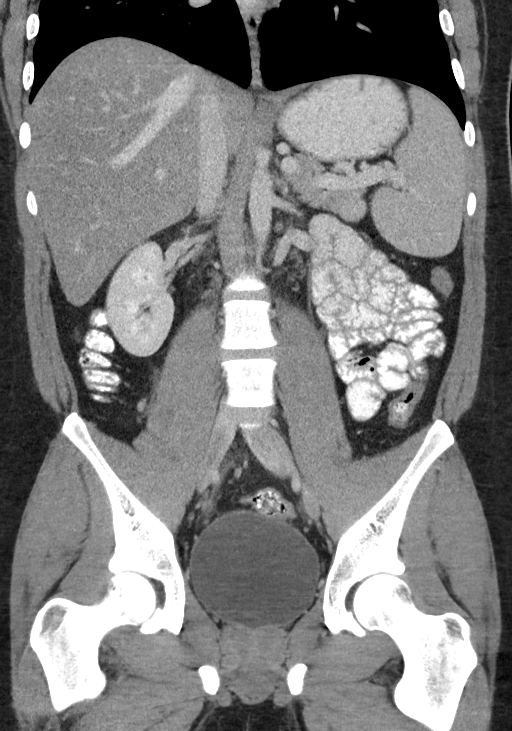

[16 of 46 positions shown; findings below may reference images not displayed]

FINDINGS: Lower chest: Lung bases show no acute findings. Heart size normal.
No pericardial or pleural effusion.

Hepatobiliary: Liver is decreased in attenuation diffusely. Liver
and gallbladder are otherwise unremarkable. No biliary ductal
dilatation.

Pancreas: Negative.

Spleen: Negative.

Adrenals/Urinary Tract: Adrenal glands and kidneys are unremarkable.
Ureters are decompressed. Bladder is unremarkable.

Stomach/Bowel: Stomach and small bowel are unremarkable. Appendix is
dilated and contains fluid, air and an appendicolith. Associated
periappendiceal stranding and fluid. No extraluminal air or
organized fluid collection to indicate rupture or abscess. Colon is
unremarkable.

Vascular/Lymphatic: Vascular structures are unremarkable. No
pathologically enlarged lymph nodes. Ileocolic mesenteric lymph
nodes are sub cm in short axis size.

Reproductive: Prostate is visualized.

Other: No free fluid. Mesenteries and peritoneum are otherwise
unremarkable.

Musculoskeletal: No worrisome lytic or sclerotic lesions.
IMPRESSION: 1. Appendicitis without complicating feature.
2. Liver appears fatty.

## 2018-05-30 ENCOUNTER — Other Ambulatory Visit: Payer: Self-pay

## 2018-05-30 ENCOUNTER — Emergency Department
Admission: EM | Admit: 2018-05-30 | Discharge: 2018-05-30 | Disposition: A | Discharge status: Home / Self Care | Payer: Self-pay | Attending: Emergency Medicine | Admitting: Emergency Medicine

## 2018-05-30 DIAGNOSIS — B354 Tinea corporis: Secondary | ICD-10-CM | POA: Insufficient documentation

## 2018-05-30 DIAGNOSIS — B86 Scabies: Secondary | ICD-10-CM | POA: Insufficient documentation

## 2018-05-30 DIAGNOSIS — Z79899 Other long term (current) drug therapy: Secondary | ICD-10-CM | POA: Insufficient documentation

## 2018-05-30 MED ORDER — PERMETHRIN 5 % EX CREA: 1.0000 "application " | TOPICAL_CREAM | Freq: Once | CUTANEOUS | 0 refills | Status: AC

## 2018-05-30 MED ORDER — CLOTRIMAZOLE 1 % EX CREA: 1.0000 "application " | TOPICAL_CREAM | Freq: Two times a day (BID) | CUTANEOUS | 0 refills | Status: AC

## 2018-05-30 MED ORDER — TRIAMCINOLONE ACETONIDE 0.1 % EX CREA: 1.0000 "application " | TOPICAL_CREAM | Freq: Four times a day (QID) | CUTANEOUS | 0 refills | Status: AC

## 2018-05-30 MED ORDER — IVERMECTIN 3 MG PO TABS
200.0000 ug/kg | ORAL_TABLET | Freq: Once | ORAL | Status: AC
  Administered 2018-05-30: 22500 ug via ORAL
  Filled 2018-05-30: qty 8

## 2018-05-30 NOTE — ED Provider Notes (Signed)
[facility] Emergency Department Provider Note  ____________________________________________  Time seen: Approximately 10:43 PM  I have reviewed the triage vital signs and the nursing notes.   HISTORY  Chief Complaint Rash    HPI Oscar Lynch is a 22 y.o. male who presents the emergency department complaining of 2 separate rashes.  Patient has a fine rash to bilateral dorsal hands, bilateral upper arms, chest wall.  Patient is also endorsing a circular, scaly rash to the lower abdominal wall.  Patient reports that first rash is been present for approximately 2 weeks.  Rash to the abdomen has been there approximately 1 week.  Patient has tried over-the-counter topicals with no relief of symptoms.  He reports that initially he thought he had come into contact with something and that the rash would subside.  Patient reports that the rash is spreading as well as becoming more pruritic in nature.  No systemic complaints at this time.  Patient denies any other complaints at this time.   Past Medical History:  Diagnosis Date  . Appendicitis 10/21/2016    Patient Active Problem List   Diagnosis Date Noted  . Appendicitis, acute 10/21/2016  . Acute appendicitis     Past Surgical History:  Procedure Laterality Date  . LAPAROSCOPIC APPENDECTOMY N/A 10/21/2016   Procedure: APPENDECTOMY LAPAROSCOPIC;  Surgeon: Leafy Ro, MD;  Location: [facility] ORS;  Service: General;  Laterality: N/A;    Prior to Admission medications   Medication Sig Start Date End Date Taking? Authorizing Provider  clotrimazole (LOTRIMIN) 1 % cream Apply 1 application topically 2 (two) times daily. Apply to abdomen rash 05/30/18   Andilyn Bettcher, Delorise Royals, PA-C  permethrin (ELIMITE) 5 % cream Apply 1 application topically once for 1 dose. 05/30/18 05/30/18  Sanyiah Kanzler, Delorise Royals, PA-C  triamcinolone cream (KENALOG) 0.1 % Apply 1 application topically 4 (four) times daily. Apply only to most itchy areas  05/30/18   Anagha Loseke, Delorise Royals, PA-C    Allergies Patient has no known allergies.  Family History  Problem Relation Age of Onset  . Healthy Mother   . Healthy Father     Social History Social History   Tobacco Use  . Smoking status: Never Smoker  . Smokeless tobacco: Never Used  Substance Use Topics  . Alcohol use: No  . Drug use: No     Review of Systems  Constitutional: No fever/chills Eyes: No visual changes. Cardiovascular: no chest pain. Respiratory: no cough. No SOB. Gastrointestinal: No abdominal pain.  No nausea, no vomiting.  Musculoskeletal: Negative for musculoskeletal pain. Skin: Positive for 2 separate rashes, one fine, erythematous rash as well as one circular, scaly rash. Neurological: Negative for headaches, focal weakness or numbness. 10-point ROS otherwise negative.  ____________________________________________   PHYSICAL EXAM:  VITAL SIGNS: ED Triage Vitals  Enc Vitals Group     BP 05/30/18 2149 139/85     Pulse Rate 05/30/18 2149 89     Resp 05/30/18 2149 16     Temp 05/30/18 2149 98.8 F (37.1 C)     Temp Source 05/30/18 2149 Oral     SpO2 05/30/18 2149 100 %     Weight 05/30/18 2150 240 lb (108.9 kg)     Height 05/30/18 2150 5\' 10"  (1.778 m)     Head Circumference --      Peak Flow --      Pain Score 05/30/18 2150 0     Pain Loc --      Pain Edu? --  Excl. in GC? --      Constitutional: Alert and oriented. Well appearing and in no acute distress. Eyes: Conjunctivae are normal. PERRL. EOMI. Head: Atraumatic. Neck: No stridor.    Cardiovascular: Normal rate, regular rhythm. Normal S1 and S2.  Good peripheral circulation. Respiratory: Normal respiratory effort without tachypnea or retractions. Lungs CTAB. Good air entry to the bases with no decreased or absent breath sounds. Musculoskeletal: Full range of motion to all extremities. No gross deformities appreciated. Neurologic:  Normal speech and language. No gross focal  neurologic deficits are appreciated.  Skin:  Skin is warm, dry and intact. Patient has a fine rash to bilateral dorsal hands, bilateral upper arms, chest wall.  Patient has scratch that these to the point of excoriation.  No surrounding erythema  or edema.  Patient does have linear spread of multiple lesions.  Findings are consistent with scabies.  Patient also has a circular, scaly rash noted to the lower abdominal wall/skin fold underlying waistband.  Rash is consistent with ringworm. Psychiatric: Mood and affect are normal. Speech and behavior are normal. Patient exhibits appropriate insight and judgement.   ____________________________________________   LABS (all labs ordered are listed, but only abnormal results are displayed)  Labs Reviewed - No data to display ____________________________________________  EKG   ____________________________________________  RADIOLOGY   No results found.  ____________________________________________    PROCEDURES  Procedure(s) performed:    Procedures    Medications  ivermectin (STROMECTOL) tablet 22,500 mcg (has no administration in time range)     ____________________________________________   INITIAL IMPRESSION / ASSESSMENT AND PLAN / ED COURSE  Pertinent labs & imaging results that were available during my care of the patient were reviewed by me and considered in my medical decision making (see chart for details).  Review of the [place] CSRS was performed in accordance of the [facility] prior to dispensing any controlled drugs.      Patient's diagnosis is consistent with scabies and ringworm.  Patient presents the emergency department 2 separate rashes.  Evaluation of both rashes reveals findings consistent with significant scabies as well as localized ringworm.  Patient is given ivermectin here in the emergency department and will be discharged with permethrin as well as triamcinolone for scabies.  Patient is written a prescription  for clotrimazole topical for tinea corporis..  Instructions on how to eradicate scabies infestation is given to patient.  She will follow-up with primary care as needed.  Patient is given ED precautions to return to the ED for any worsening or new symptoms.     ____________________________________________  FINAL CLINICAL IMPRESSION(S) / ED DIAGNOSES  Final diagnoses:  Scabies  Tinea corporis      NEW MEDICATIONS STARTED DURING THIS VISIT:  ED Discharge Orders        Ordered    triamcinolone cream (KENALOG) 0.1 %  4 times daily     05/30/18 2246    permethrin (ELIMITE) 5 % cream   Once     05/30/18 2246    clotrimazole (LOTRIMIN) 1 % cream  2 times daily     05/30/18 2246          This chart was dictated using voice recognition software/Dragon. Despite best efforts to proofread, errors can occur which can change the meaning. Any change was purely unintentional.    Racheal PatchesCuthriell, Taariq Leitz D, PA-C 05/30/18 2309    Emily FilbertWilliams, Katai Marsico E, MD 05/31/18 [phone]

## 2018-05-30 NOTE — ED Triage Notes (Signed)
Pt states itchy rash to bilateral arm and legs for a week. Pt with discrete red raised rash noted to bilateral forearms.

## 2018-05-30 NOTE — ED Notes (Signed)
No peripheral IV placed this visit.   Discharge instructions reviewed with patient. Questions fielded by this RN. Patient verbalizes understanding of instructions. Patient discharged home in stable condition per Jonathon, PA. No acute distress noted at time of discharge.   

## 2024-07-14 ENCOUNTER — Emergency Department
Admission: EM | Admit: 2024-07-14 | Discharge: 2024-07-14 | Disposition: A | Discharge status: Home / Self Care | Attending: Emergency Medicine | Admitting: Emergency Medicine

## 2024-07-14 ENCOUNTER — Emergency Department

## 2024-07-14 ENCOUNTER — Other Ambulatory Visit: Payer: Self-pay

## 2024-07-14 DIAGNOSIS — N202 Calculus of kidney with calculus of ureter: Secondary | ICD-10-CM | POA: Diagnosis not present

## 2024-07-14 DIAGNOSIS — N200 Calculus of kidney: Secondary | ICD-10-CM

## 2024-07-14 DIAGNOSIS — R109 Unspecified abdominal pain: Secondary | ICD-10-CM | POA: Diagnosis present

## 2024-07-14 LAB — URINALYSIS, ROUTINE W REFLEX MICROSCOPIC
Bilirubin Urine: NEGATIVE
Glucose, UA: NEGATIVE mg/dL
Hgb urine dipstick: NEGATIVE
Ketones, ur: NEGATIVE mg/dL
Leukocytes,Ua: NEGATIVE
Nitrite: NEGATIVE
Protein, ur: NEGATIVE mg/dL
Specific Gravity, Urine: 1.02 (ref 1.005–1.030)
pH: 5 (ref 5.0–8.0)

## 2024-07-14 LAB — BASIC METABOLIC PANEL WITH GFR
Anion gap: 10 (ref 5–15)
BUN: 12 mg/dL (ref 6–20)
CO2: 23 mmol/L (ref 22–32)
Calcium: 9.5 mg/dL (ref 8.9–10.3)
Chloride: 105 mmol/L (ref 98–111)
Creatinine, Ser: 0.75 mg/dL (ref 0.61–1.24)
GFR, Estimated: >60 mL/min (ref >60)
Glucose, Bld: 212 mg/dL — ABNORMAL HIGH (ref 70–99)
Potassium: 3.8 mmol/L (ref 3.5–5.1)
Sodium: 138 mmol/L (ref 135–145)

## 2024-07-14 LAB — CBC WITH DIFFERENTIAL/PLATELET
Abs Immature Granulocytes: 0.05 K/uL (ref 0.00–0.07)
Basophils Absolute: 0.1 K/uL (ref 0.0–0.1)
Basophils Relative: 1 %
Eosinophils Absolute: 0.2 K/uL (ref 0.0–0.5)
Eosinophils Relative: 2 %
HCT: 40.5 % (ref 39.0–52.0)
Hemoglobin: 14.2 g/dL (ref 13.0–17.0)
Immature Granulocytes: 0 %
Lymphocytes Relative: 29 %
Lymphs Abs: 4.2 K/uL — ABNORMAL HIGH (ref 0.7–4.0)
MCH: 27.8 pg (ref 26.0–34.0)
MCHC: 35.1 g/dL (ref 30.0–36.0)
MCV: 79.3 fL — ABNORMAL LOW (ref 80.0–100.0)
Monocytes Absolute: 1.1 K/uL — ABNORMAL HIGH (ref 0.1–1.0)
Monocytes Relative: 7 %
Neutro Abs: 8.8 K/uL — ABNORMAL HIGH (ref 1.7–7.7)
Neutrophils Relative %: 61 %
Platelets: 210 K/uL (ref 150–400)
RBC: 5.11 MIL/uL (ref 4.22–5.81)
RDW: 12.4 % (ref 11.5–15.5)
WBC: 14.4 K/uL — ABNORMAL HIGH (ref 4.0–10.5)
nRBC: 0 % (ref 0.0–0.2)

## 2024-07-14 MED ORDER — TAMSULOSIN HCL 0.4 MG PO CAPS: 0.4000 mg | ORAL_CAPSULE | Freq: Every day | ORAL | 0 refills | Status: AC

## 2024-07-14 MED ORDER — ONDANSETRON HCL 4 MG/2ML IJ SOLN
4.0000 mg | Freq: Once | INTRAMUSCULAR | Status: AC
  Administered 2024-07-14: 4 mg via INTRAVENOUS
  Filled 2024-07-14: qty 2

## 2024-07-14 MED ORDER — ONDANSETRON 4 MG PO TBDP: 4.0000 mg | ORAL_TABLET | Freq: Three times a day (TID) | ORAL | 0 refills | Status: DC | PRN

## 2024-07-14 MED ORDER — SODIUM CHLORIDE 0.9 % IV BOLUS
1000.0000 mL | Freq: Once | INTRAVENOUS | Status: AC
  Administered 2024-07-14: 1000 mL via INTRAVENOUS

## 2024-07-14 MED ORDER — OXYCODONE HCL 5 MG PO TABS: 5.0000 mg | ORAL_TABLET | Freq: Three times a day (TID) | ORAL | 0 refills | Status: AC | PRN

## 2024-07-14 MED ORDER — OXYCODONE HCL 5 MG PO TABS: 5.0000 mg | ORAL_TABLET | Freq: Once | ORAL | Status: DC

## 2024-07-14 MED ORDER — TAMSULOSIN HCL 0.4 MG PO CAPS: 0.4000 mg | ORAL_CAPSULE | Freq: Every day | ORAL | 0 refills | Status: DC

## 2024-07-14 MED ORDER — ONDANSETRON 4 MG PO TBDP: 4.0000 mg | ORAL_TABLET | Freq: Three times a day (TID) | ORAL | 0 refills | Status: AC | PRN

## 2024-07-14 MED ORDER — OXYCODONE HCL 5 MG PO TABS: 5.0000 mg | ORAL_TABLET | Freq: Three times a day (TID) | ORAL | 0 refills | Status: DC | PRN

## 2024-07-14 MED ORDER — MORPHINE SULFATE (PF) 4 MG/ML IV SOLN
4.0000 mg | Freq: Once | INTRAVENOUS | Status: AC
  Administered 2024-07-14: 4 mg via INTRAVENOUS
  Filled 2024-07-14: qty 1

## 2024-07-14 MED ORDER — KETOROLAC TROMETHAMINE 30 MG/ML IJ SOLN
30.0000 mg | Freq: Once | INTRAMUSCULAR | Status: AC
  Administered 2024-07-14: 30 mg via INTRAMUSCULAR
  Filled 2024-07-14: qty 1

## 2024-07-14 MED ORDER — ACETAMINOPHEN 500 MG PO TABS
1000.0000 mg | ORAL_TABLET | Freq: Once | ORAL | Status: AC
  Administered 2024-07-14: 1000 mg via ORAL
  Filled 2024-07-14: qty 2

## 2024-07-14 NOTE — Discharge Instructions (Signed)
 Take acetaminophen  650 mg and ibuprofen  400 mg every 6 hours for pain.  Take with food. Take oxycodone  as needed for severe/breakthrough pain only. Take Flomax  daily which can help move the stone along and out of the body and help relax the ureters to help with pain.  Call urologist for an appointment.  Thank you for choosing us  for your health care today!  Please see your primary doctor this week for a follow up appointment.   If you have any new, worsening, or unexpected symptoms call your doctor right away or come back to the emergency department for reevaluation.  It was my pleasure to care for you today.   Ginnie EDISON Cyrena, MD

## 2024-07-14 NOTE — ED Notes (Addendum)
 Discharge paperwork and education provided to pt. Discharge paperwork at bedside. Pt given urinary straining device. Pt denies pain and nausea at this time. Call bell in place and lights dimmed for comfort. 4hr med hold explained to pt as pt drove self here and received IV morphine  at 0406. Advised will discharge pt at 0800 per facility protocol.

## 2024-07-14 NOTE — ED Provider Notes (Addendum)
 [facility] Provider Note    Event Date/Time   First MD Initiated Contact with Patient 07/14/24 [phone]     (approximate)   History   Flank Pain   HPI  KACE HARTJE is a 28 y.o. male   Past medical history of appendicitis status post laparoscopic appendectomy who presents to the Emergency Department with dysuria, and groin and right-sided flank pain starting this morning.  Radiates to his groin.  Denies fever or chills.  No history of kidney stone.  No penile discharge.   External Medical Documents Reviewed: Prior hospital notes including surgery for appendectomy in 2017      Physical Exam   Triage Vital Signs: ED Triage Vitals  Encounter Vitals Group     BP 07/14/24 0322 (!) 157/105     Girls Systolic BP Percentile --      Girls Diastolic BP Percentile --      Boys Systolic BP Percentile --      Boys Diastolic BP Percentile --      Pulse Rate 07/14/24 0322 96     Resp 07/14/24 0322 18     Temp 07/14/24 0322 97.8 F (36.6 C)     Temp Source 07/14/24 0322 Oral     SpO2 07/14/24 0322 100 %     Weight 07/14/24 0321 245 lb (111.1 kg)     Height 07/14/24 0321 5' 10 (1.778 m)     Head Circumference --      Peak Flow --      Pain Score 07/14/24 0321 10     Pain Loc --      Pain Education --      Exclude from Growth Chart --     Most recent vital signs: Vitals:   07/14/24 0322 07/14/24 0430  BP: (!) 157/105 137/89  Pulse: 96 82  Resp: 18 17  Temp: 97.8 F (36.6 C)   SpO2: 100% 99%    General: Awake CV:  Good peripheral perfusion.  Resp:  Normal effort.  Abd:  No distention.  Other:  Looks uncomfortable.  Soft benign abdominal exam nonperitoneal nonfocal.  Hypertensive otherwise vital signs within normal limit.  Scrotum appears normal without skin changes no testicular swelling or tenderness to palpation.   ED Results / Procedures / Treatments   Labs (all labs ordered are listed, but only abnormal results are displayed) Labs  Reviewed  CBC WITH DIFFERENTIAL/PLATELET - Abnormal; Notable for the following components:      Result Value   WBC 14.4 (*)    MCV 79.3 (*)    Neutro Abs 8.8 (*)    Lymphs Abs 4.2 (*)    Monocytes Absolute 1.1 (*)    All other components within normal limits  BASIC METABOLIC PANEL WITH GFR - Abnormal; Notable for the following components:   Glucose, Bld 212 (*)    All other components within normal limits  URINALYSIS, ROUTINE W REFLEX MICROSCOPIC - Abnormal; Notable for the following components:   Color, Urine YELLOW (*)    APPearance CLEAR (*)    All other components within normal limits     I ordered and reviewed the above labs they are notable for leukocytosis with a white blood cell count of 14.4   RADIOLOGY I independently reviewed and interpreted CT of the abdomen pelvis as he has small hyperdensity in the distal ureter concerning for ureterolithiasis I also reviewed radiologist's formal read.   PROCEDURES:  Critical Care performed: No  Procedures  MEDICATIONS ORDERED IN ED: Medications  ketorolac  (TORADOL ) 30 MG/ML injection 30 mg (30 mg Intramuscular Given 07/14/24 0355)  acetaminophen  (TYLENOL ) tablet 1,000 mg (1,000 mg Oral Given 07/14/24 0356)  sodium chloride  0.9 % bolus 1,000 mL (1,000 mLs Intravenous New Bag/Given 07/14/24 0406)  ondansetron  (ZOFRAN ) injection 4 mg (4 mg Intravenous Given 07/14/24 0405)  morphine  (PF) 4 MG/ML injection 4 mg (4 mg Intravenous Given 07/14/24 0406)     IMPRESSION / MDM / ASSESSMENT AND PLAN / ED COURSE  I reviewed the triage vital signs and the nursing notes.                                Patient's presentation is most consistent with acute presentation with potential threat to life or bodily function.  Differential diagnosis includes, but is not limited to, urinary tract infection, pyelonephritis, STI, renal colic or kidney stone, considered but less likely biliary pathologies   The patient is on the cardiac monitor to  evaluate for evidence of arrhythmia and/or significant heart rate changes.  MDM:    Symptomatology consistent with renal colic and indeed has a small 2 mm UVJ stone in the right side that corresponds to his symptoms.  Plan for antiemetic, pain control, reassessment.  No concomitant urine infection on urinalysis fortunately.  Reassessed and pain controlled will start on Flomax  and have him follow-up with urology.        FINAL CLINICAL IMPRESSION(S) / ED DIAGNOSES   Final diagnoses:  Kidney stone     Rx / DC Orders   ED Discharge Orders          Ordered    ondansetron  (ZOFRAN -ODT) 4 MG disintegrating tablet  Every 8 hours PRN        07/14/24 0412    oxyCODONE  (ROXICODONE ) 5 MG immediate release tablet  Every 8 hours PRN        07/14/24 0412    tamsulosin  (FLOMAX ) 0.4 MG CAPS capsule  Daily        07/14/24 0413             Note:  This document was prepared using Dragon voice recognition software and may include unintentional dictation errors.    Cyrena Mylar, MD 07/14/24 [id number]    Cyrena Mylar, MD 07/14/24 [phone]

## 2024-07-14 NOTE — ED Triage Notes (Signed)
 Pt reports right side flank pain that began this morning, pt reports dysuria and generalized discomfort in his groin.
